# Patient Record
Sex: Female | Born: 1985 | Hispanic: Yes | Marital: Married | Smoking: Never smoker
Health system: Western US, Academic
[De-identification: ages and names within clinical notes are randomized; demographics above are authoritative.]

## PROBLEM LIST (undated history)

## (undated) DIAGNOSIS — G510 Bell's palsy: Secondary | ICD-10-CM

## (undated) DIAGNOSIS — M069 Rheumatoid arthritis, unspecified: Secondary | ICD-10-CM

## (undated) DIAGNOSIS — M26609 Unspecified temporomandibular joint disorder, unspecified side: Secondary | ICD-10-CM

## (undated) HISTORY — DX: Unspecified temporomandibular joint disorder, unspecified side: M26.609

## (undated) HISTORY — DX: Bell's palsy: G51.0

## (undated) HISTORY — DX: Rheumatoid arthritis, unspecified (CMS-HCC): M06.9

---

## 2018-08-09 IMAGING — MR MRI LSPINE WO/W CONTRAST
8 series · 48 of 48 positions shown · IV contrast (prohance)
Comparison: Lumbar spine radiographs 11/18/2017

INDICATION: Lumbar radiculopathy .
TECHNIQUE: Multiplanar, multiecho MR imaging of the lumbar spine was performed prior to and following 15 mL ProHance intravenous contrast, including T1-weighted and fluid sensitive sequences.

[Series 2: t2_sag · sagittal · 4.0mm · 0.78mm/px · 3 of 16 slices shown]
[im 1/16]
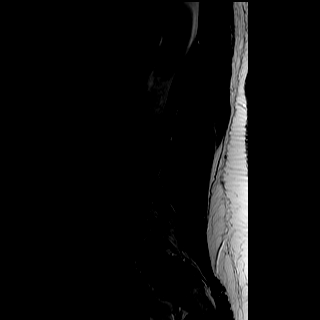
[im 8/16]
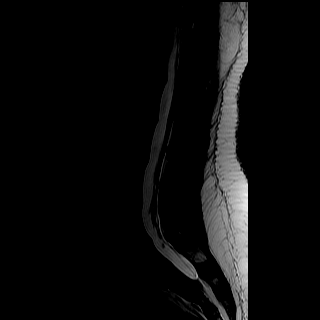
[im 16/16]
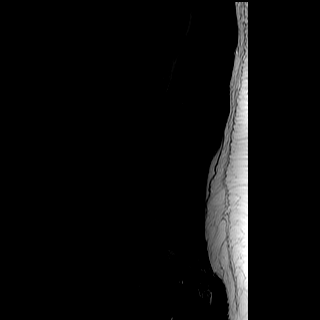

[Series 3: t1_sag · sagittal · 4.0mm · 0.78mm/px · 4 of 16 slices shown]
[im 1/16]
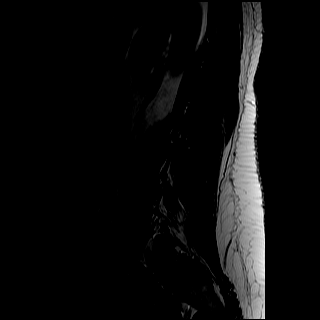
[im 6/16]
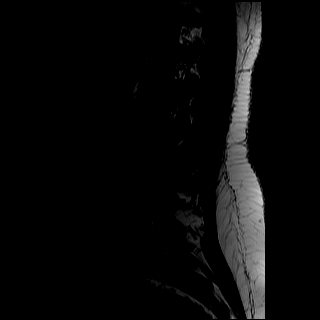
[im 11/16]
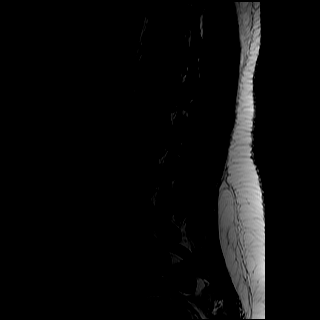
[im 16/16]
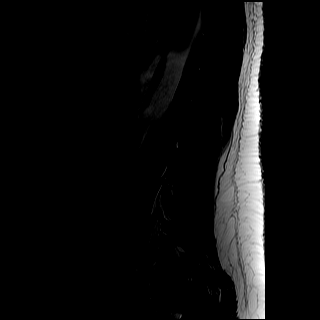

[Series 4: ir_sag · sagittal · 4.0mm · 0.98mm/px · 4 of 16 slices shown]
[im 1/16]
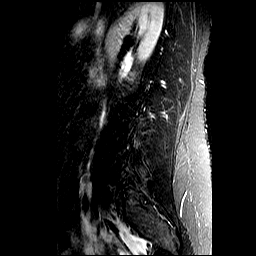
[im 6/16]
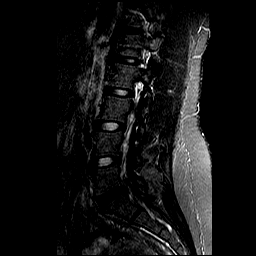
[im 11/16]
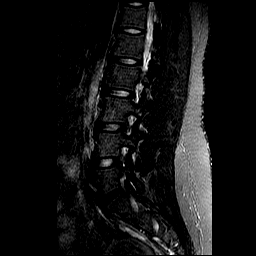
[im 16/16]
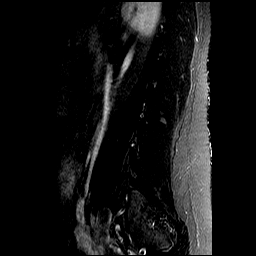

[Series 5: t2_axial · axial · 4.0mm · 0.50mm/px · z∈[-33,+137]mm · 9 of 35 slices shown]
[im 1/35]
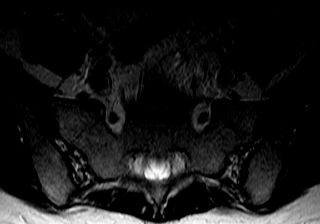
[im 5/35]
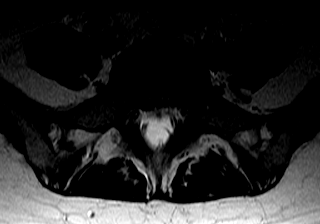
[im 9/35]
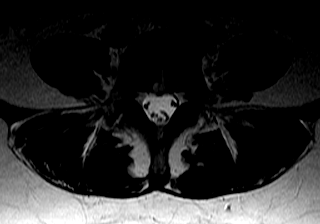
[im 13/35]
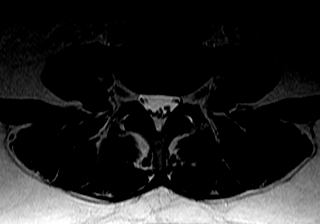
[im 18/35]
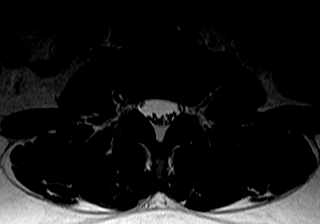
[im 22/35]
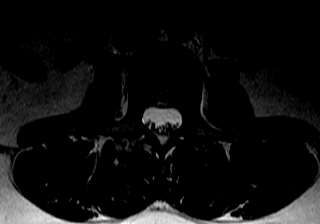
[im 26/35]
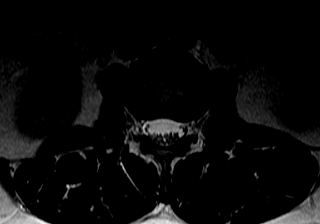
[im 30/35]
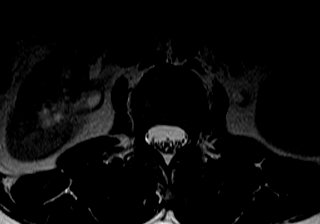
[im 35/35]
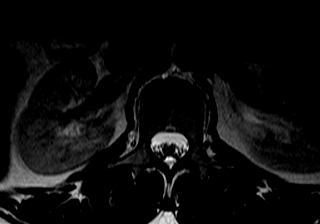

[Series 6: t1_axial_obl · axial · 4.0mm · 0.51mm/px · z∈[-34,+139]mm · 6 of 25 slices shown]
[im 1/25]
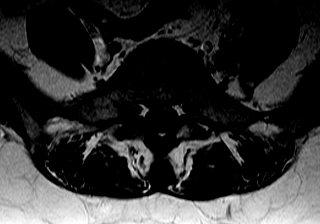
[im 5/25]
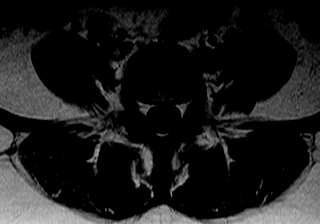
[im 10/25]
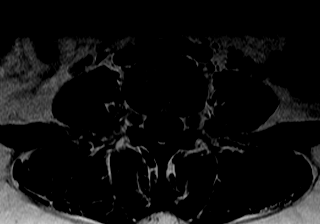
[im 15/25]
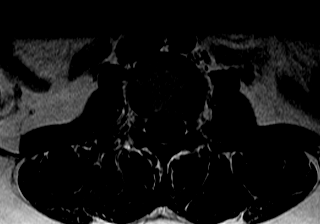
[im 20/25]
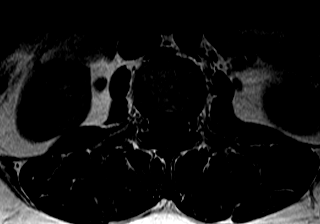
[im 25/25]
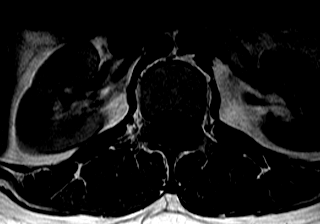

[Series 7: t1_axial_fs · axial · 4.0mm · 0.64mm/px · z∈[-33,+137]mm · 9 of 35 slices shown]
[im 1/35]
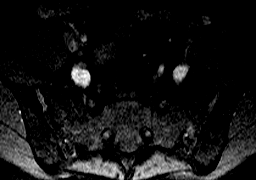
[im 5/35]
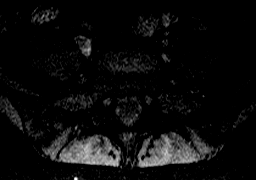
[im 9/35]
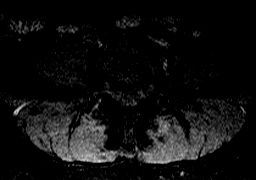
[im 13/35]
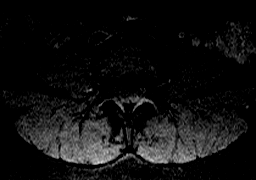
[im 18/35]
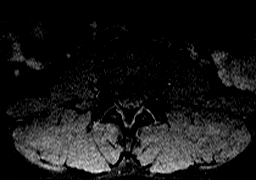
[im 22/35]
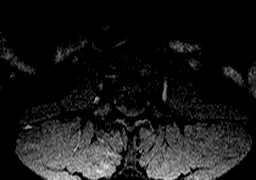
[im 26/35]
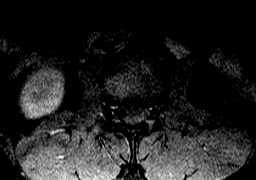
[im 30/35]
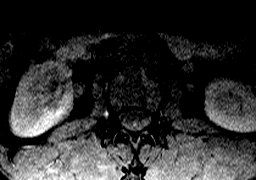
[im 35/35]
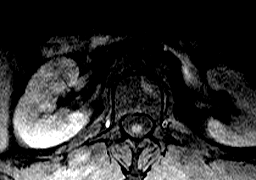

[Series 8: t1_axial_fs_+c · axial · 4.0mm · 0.64mm/px · z∈[-33,+137]mm · 9 of 35 slices shown]
[im 1/35]
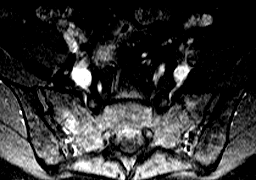
[im 5/35]
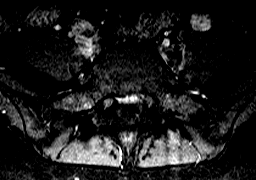
[im 9/35]
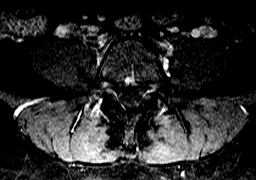
[im 13/35]
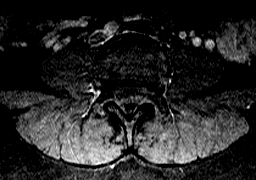
[im 18/35]
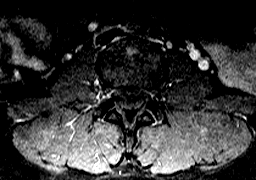
[im 22/35]
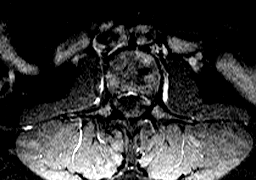
[im 26/35]
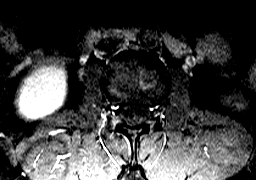
[im 30/35]
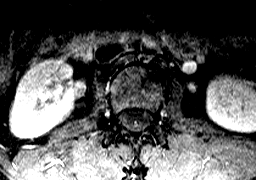
[im 35/35]
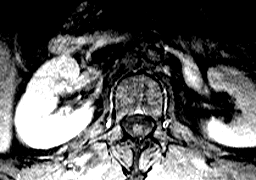

[Series 9: t1_sag_fs_+c · sagittal · 4.0mm · 0.49mm/px · 4 of 16 slices shown]
[im 1/16]
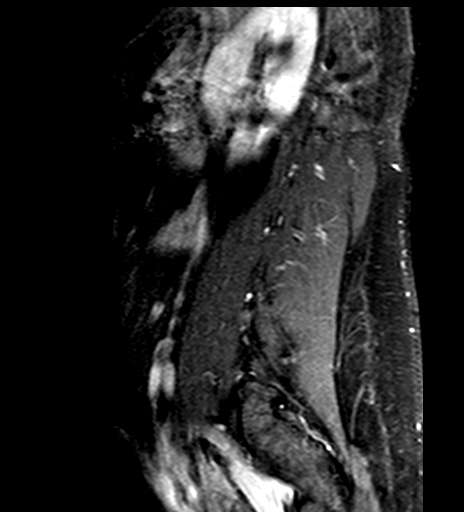
[im 6/16]
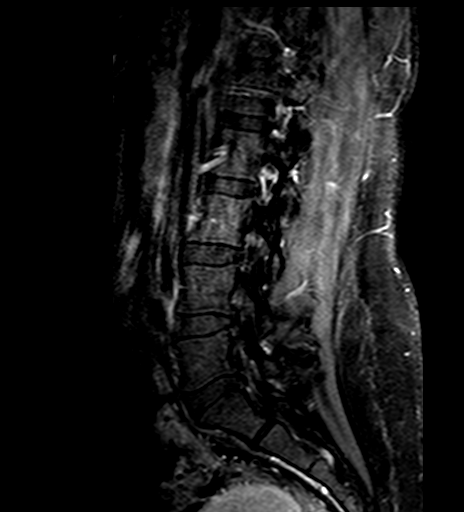
[im 11/16]
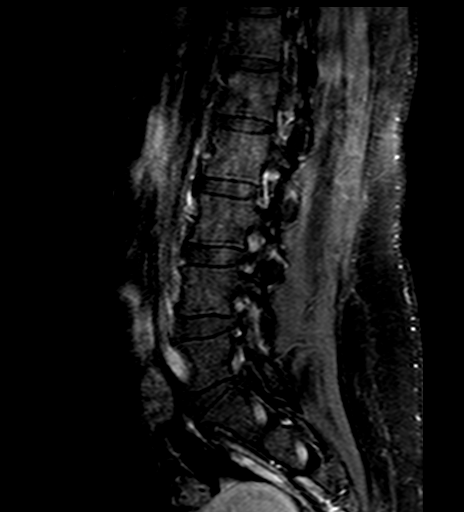
[im 16/16]
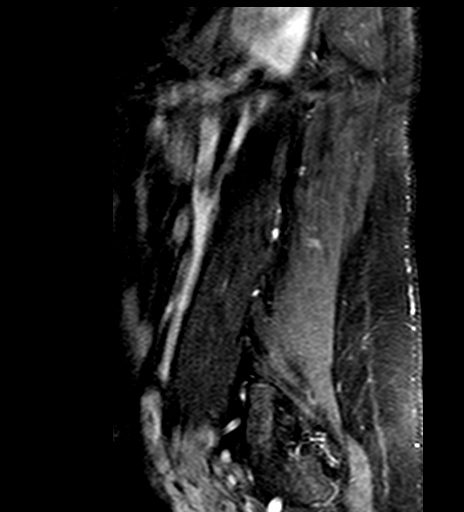

[48 of 48 positions shown; findings below may reference images not displayed]

FINDINGS: Conus: The conus is normal in appearance and position.  

Alignment: The alignment of the lumbar spine is normal on these supine, neutral images.   

Marrow: No acute fracture. No pars defect.  

T11-T12 and T12-L1 levels are seen only on the sagittal images; no posterior disc bulge, canal narrowing, or foraminal narrowing at these levels.

L1-L2: No disc protrusion, canal stenosis, or foraminal stenosis.

L2-L3: Minimal disc bulge. No canal or foraminal stenosis.

L3-L4: Minimal disc bulge. No canal or foraminal stenosis.

L4-L5: No disc protrusion, canal stenosis, or foraminal stenosis.

L5-S1: Moderate right paracentral disc protrusion with annular tear. Impingement of the descending right S1 nerve root. Moderate right lateral recess narrowing. Mild canal stenosis. No foraminal stenosis.

Visualized SI joints: Minimal DJD.

Visualized soft tissues: Unremarkable. No suspicious postcontrast enhancement.
IMPRESSION: Moderate right paracentral disc protrusion at L5-S1 with annular tear, impingement of the descending right S1 nerve root, and moderate right lateral recess narrowing.

## 2021-04-03 ENCOUNTER — Ambulatory Visit (INDEPENDENT_AMBULATORY_CARE_PROVIDER_SITE_OTHER): Admitting: Neurology

## 2021-04-03 ENCOUNTER — Encounter (INDEPENDENT_AMBULATORY_CARE_PROVIDER_SITE_OTHER): Payer: Self-pay | Admitting: Neurology

## 2021-04-03 VITALS — BP 112/75 | HR 81 | Ht 64.5 in | Wt 173.0 lb

## 2021-04-03 MED ORDER — DICLOFENAC SODIUM 1 % EX GEL
CUTANEOUS | Status: AC
Start: 2020-12-15 — End: ?

## 2021-04-03 MED ORDER — AMITRIPTYLINE HCL 10 MG OR TABS
ORAL_TABLET | ORAL | 3 refills | Status: AC
Start: 2021-04-03 — End: ?

## 2021-05-16 ENCOUNTER — Telehealth (INDEPENDENT_AMBULATORY_CARE_PROVIDER_SITE_OTHER): Admitting: Neurology

## 2021-05-29 ENCOUNTER — Encounter (INDEPENDENT_AMBULATORY_CARE_PROVIDER_SITE_OTHER): Payer: Self-pay | Admitting: Neurology

## 2021-05-29 ENCOUNTER — Telehealth: Admitting: Neurology

## 2021-05-29 VITALS — Ht 64.0 in | Wt 170.0 lb

## 2021-05-29 MED ORDER — CLINDAMYCIN PHOSPHATE 1 % EX LOTN
TOPICAL_LOTION | CUTANEOUS | Status: AC
Start: 2021-05-16 — End: ?

## 2021-05-29 MED ORDER — DRYSOL 20 % EX SOLN
CUTANEOUS | Status: AC
Start: 2021-05-16 — End: ?

## 2021-05-29 MED ORDER — RIZATRIPTAN BENZOATE 10 MG OR TABS
10.0000 mg | ORAL_TABLET | ORAL | Status: DC | PRN
Start: 2021-04-30 — End: 2022-01-25

## 2021-05-29 MED ORDER — DROSPIRENONE-ETHINYL ESTRADIOL 3-0.02 MG OR TABS
1.0000 | ORAL_TABLET | Freq: Every day | ORAL | Status: DC
Start: ? — End: 2022-01-25

## 2021-07-18 ENCOUNTER — Encounter (INDEPENDENT_AMBULATORY_CARE_PROVIDER_SITE_OTHER): Payer: Self-pay

## 2021-12-28 ENCOUNTER — Encounter (INDEPENDENT_AMBULATORY_CARE_PROVIDER_SITE_OTHER): Payer: Self-pay | Admitting: Obstetrics & Gynecology

## 2021-12-28 ENCOUNTER — Other Ambulatory Visit: Payer: TRICARE Prime—HMO | Attending: Anatomic Pathology & Clinical Pathology | Admitting: Obstetrics & Gynecology

## 2021-12-28 VITALS — BP 118/77 | HR 84 | Temp 97.9°F | Resp 19 | Ht 64.0 in | Wt 167.0 lb

## 2021-12-28 DIAGNOSIS — N939 Abnormal uterine and vaginal bleeding, unspecified: Secondary | ICD-10-CM

## 2021-12-28 DIAGNOSIS — Z23 Encounter for immunization: Secondary | ICD-10-CM

## 2021-12-28 DIAGNOSIS — N83201 Unspecified ovarian cyst, right side: Secondary | ICD-10-CM

## 2021-12-28 DIAGNOSIS — N83202 Unspecified ovarian cyst, left side: Secondary | ICD-10-CM

## 2021-12-28 DIAGNOSIS — R87810 Cervical high risk human papillomavirus (HPV) DNA test positive: Secondary | ICD-10-CM

## 2021-12-28 DIAGNOSIS — Z202 Contact with and (suspected) exposure to infections with a predominantly sexual mode of transmission: Secondary | ICD-10-CM

## 2021-12-28 DIAGNOSIS — R232 Flushing: Secondary | ICD-10-CM | POA: Insufficient documentation

## 2021-12-28 DIAGNOSIS — Z124 Encounter for screening for malignant neoplasm of cervix: Secondary | ICD-10-CM | POA: Insufficient documentation

## 2021-12-28 DIAGNOSIS — R87610 Atypical squamous cells of undetermined significance on cytologic smear of cervix (ASC-US): Secondary | ICD-10-CM

## 2021-12-28 DIAGNOSIS — D069 Carcinoma in situ of cervix, unspecified: Secondary | ICD-10-CM

## 2021-12-28 DIAGNOSIS — E282 Polycystic ovarian syndrome: Secondary | ICD-10-CM | POA: Insufficient documentation

## 2021-12-28 NOTE — Progress Notes (Unsigned)
ID: Dana Ward is a 36 year old [id number] who presents today for history of CIN3, PCOS, pelvic pain. Dana Ward was referred to this clinic by Dr. Letta Pate.    HPI: Dana Ward is referred with the following information:        Seeing folks for TMJ (on amitryp)  Also seeing folks for RA - on gabapentin    PCOS: hot flashes, sweating    Hyperhydrosis    Gave birth to son in 2017. Has had lower pain since then.   Reports such severe pelvic pain that has gone to ED, was told multiple cysts? This happened earlier this year (?April)?    She has a history of a LEEP 03/2020 in [place]  F/u have been abnormal due to HPV. Had colposcopy, was told abnormal cells (doesn't recall what they were). 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  Of note, in CareEverywhere, Narrative from 05/2020 CNM visit: "1. History of loop electrosurgical excision procedure    Reviewed negative ECC results from last visit and discussed negative exam findings on today's colpo.  Recomend 12 mos co-testing since she isn't 12 mos since LEEP and recent ECC B9 with negative colpo today.  She does not exactly follow the ASCCP guidelines since there is no 6 month post LEEP surveillance."    Last pap was 04/2021 - no colposcopy after that, but was told that there is HPV present    Not using contraception currently  Was put on birth control, but would lose hair, cough blood.  Has tried Nexplanon. Husband of 16y.  Has allergies to some condoms    Periods: irregular, skips months at times; heavy with blood clots 4-5d with some spotting before and after for a couple days. Have always been this way    Husband in the [employer]. Plans on staying in [place] here for a while.    OB/GYN History:  [id number]  History of abnormal pap smears: as above   History of sexually transmitted infections: no  Last mammogram: n/a    Past Medical History:   Diagnosis Date    Bell's palsy     resolved    Rheumatoid  arthritis (CMS-HCC)     TMJ (temporomandibular joint syndrome)        Past Surgical History:   Procedure Laterality Date    CERVICAL BIOPSY W/ LOOP ELECTRODE EXCISION  03/2020    CHOLECYSTECTOMY  2017    Laparoscopic       Family History   Problem Relation Name Age of Onset    Breast Cancer Mother's Sister  73       Social History:   reports that she has never smoked. She has never used smokeless tobacco. She reports that she does not currently use alcohol. She reports that she does not use drugs.  Social History     Substance and Sexual Activity   Sexual Activity Yes    Partners: Male       Allergies:  is allergic to erythromycin.    Meds:  I have reviewed the patient's medications; see Medication Reconciliation. and   Current Outpatient Medications:     amitriptyline (ELAVIL) 10 MG tablet, Take 1 tablet 2 hours prior to bedtime, after 2 weeks may increase to 2 tablets if tolerated (Patient taking differently: Take 2 tablets (20 mg) by mouth nightly.), Disp: 180 tablet, Rfl: 3    clindamycin phosphate (CLEOCIN T) 1 % lotion, , Disp: ,  Rfl:     diclofenac (VOLTAREN) 1 % gel,  See instructions, Apply 2 grams (upper extremities) or 4 grams (lower extremities) to affected area topically four times daily as needed for pain. Max total body dose of 32 grams per Dana, # 100 g, 0 total refill(s), Maintenance, Apply 2 grams (upper e..., Disp: , Rfl:     drospirenone-ethinyl estradiol (LORYNA) 3-0.02 MG tablet, Take 1 tablet by mouth daily., Disp: , Rfl:     DRYSOL 20 % external solution, , Disp: , Rfl:     rizatriptan (MAXALT) 10 MG tablet, Take 1 tablet (10 mg) by mouth as needed., Disp: , Rfl:             PHYSICAL EXAMINATION:    BP 118/77 (BP Location: Right arm, BP Patient Position: Sitting, BP cuff size: Regular)   Pulse 84   Temp 97.9 F (36.6 C) (Temporal)   Resp 19   Ht 5\' 4"  (1.626 m)   Wt 75.8 kg (167 lb)   LMP 12/19/2021 (Exact Date)   SpO2 99%   BMI 28.67 kg/m  Body mass index is 28.67 kg/m.    General  Appearance: in no apparent distress and well developed and well nourished  Psych: euthymic  Eyes: conjunctivae and sclera normal  Neck: Neck supple. No adenopathy. Thyroid symmetric, normal size,   Heart: regular rate and rhythm  Abdomen: Abdomen soft, non-tender. BS normal. No masses, organomegaly  Lymph: no groin nodes  Skin: no lesions/rashes  Pelvic Exam:  Vulva: Bartholin's glands, urethra, Skene's glands negative   Urethra: normal  Vagina: Normal mucosa without lesions with a small amount of normal and physiologic discharge  Cervix: Closed, mobile, friable cervix, no discrete lesions or discharge and No cervical motion tenderness  Uterus: normal size, shape, contour  Adnexa: normal without masses and no tenderness  Anus/Perineum: normal appearing, no lesions    Assessment and plan: 36 year old 29 who presents today for:    Problem List Items Addressed This Visit    None  Visit Diagnoses       High grade squamous intraepithelial lesion (HGSIL), grade 3 CIN, on biopsy of cervix    -  Primary    Need for vaccination        Relevant Orders    Flu Vaccine (Fluarix), IM, 6+ mo (Completed)    Cysts of both ovaries        Relevant Orders    [id number] Pelvic Transabd/Transvag Combination    STD exposure        Relevant Orders    Chlamydia/Gonorrhea PCR, Genital Cobas PCR collection swab    PCOS (polycystic ovarian syndrome)        Relevant Orders    Testo Free + Total Female/Child SST    Hot flashes        Relevant Orders    TSH, Blood - See Instructions    Cervical cancer screening        Relevant Orders    Cytopath Gyn (Pap Smear) (Completed)    Abnormal uterine bleeding (AUB)              #History of CIN3 - given history and friable cervix, pap smear obtained today to monitor  - will need colposcopy if abnormal  - reviewed that given her prior history and notes indicating that there has not been an abnormality on colposcopy on her last colpo, that there is no indication for hysterectomy     #Cyst of ovaries, pelvic  pain - No obvious etiology on examination. I reviewed possible causes including gynecologic, urologic, GI, and MSK. I reviewed possible structural causes including uterine or adnexal masses. I discussed the possible contribution of pelvic floor dysfunction given coincidence since delivery.   - pelvic US ordered, given interval time  - discussed ovulation suppression for symptom management, she is open to options     #PCOS - reported history. I reviewed in depth the clinical significance of this syndrome, including glucose intolerance, effects on fertility, and risk of endometrial hyperplasia when it comes to oligomenorrhea. I discussed the importance of weight loss for management of PCOS, as well as possibility of need for ovulation induction in the future for pregnancy.   I reviewed hormonal management options for PCOS, including OCPs, patch, ring, DMPA, Nexplanon, and Mirena IUD for endometrial protection.   I also reviewed use of antiandrogens for management of hyperandrogenism.  - testosterone ordered    #STI screening, friable cervix  - GC/CT swab obtained    #AUB - could be from PCOS  - pending [place] results, will discuss further    Esther Hardy, MD

## 2022-01-07 LAB — HPV HIGH RISK DNA PROBE, FEMALE
HPV High Risk Genotype 16: DETECTED — AB
HPV High Risk Genotype 18: NOT DETECTED
HPV Other High Risk Genotypes (Not type 16 or 18): NOT DETECTED

## 2022-01-21 ENCOUNTER — Ambulatory Visit
Admission: RE | Admit: 2022-01-21 | Discharge: 2022-01-21 | Disposition: A | Discharge status: Home / Self Care | Payer: TRICARE Prime—HMO | Attending: Obstetrics & Gynecology | Admitting: Obstetrics & Gynecology

## 2022-01-21 ENCOUNTER — Encounter (INDEPENDENT_AMBULATORY_CARE_PROVIDER_SITE_OTHER): Payer: Self-pay | Admitting: Obstetrics & Gynecology

## 2022-01-21 DIAGNOSIS — N83201 Unspecified ovarian cyst, right side: Secondary | ICD-10-CM | POA: Insufficient documentation

## 2022-01-21 DIAGNOSIS — N83202 Unspecified ovarian cyst, left side: Secondary | ICD-10-CM | POA: Insufficient documentation

## 2022-01-21 DIAGNOSIS — N838 Other noninflammatory disorders of ovary, fallopian tube and broad ligament: Secondary | ICD-10-CM

## 2022-01-24 ENCOUNTER — Other Ambulatory Visit (INDEPENDENT_AMBULATORY_CARE_PROVIDER_SITE_OTHER)
Admission: RE | Admit: 2022-01-24 | Discharge: 2022-01-24 | Disposition: A | Discharge status: Home / Self Care | Payer: TRICARE Prime—HMO | Attending: Obstetrics & Gynecology | Admitting: Obstetrics & Gynecology

## 2022-01-24 DIAGNOSIS — R232 Flushing: Secondary | ICD-10-CM | POA: Insufficient documentation

## 2022-01-24 DIAGNOSIS — E282 Polycystic ovarian syndrome: Secondary | ICD-10-CM | POA: Insufficient documentation

## 2022-01-24 LAB — TSH, BLOOD: TSH: 0.93 u[IU]/mL (ref 0.27–4.20)

## 2022-01-25 ENCOUNTER — Encounter (INDEPENDENT_AMBULATORY_CARE_PROVIDER_SITE_OTHER): Payer: Self-pay | Admitting: Obstetrics & Gynecology

## 2022-01-25 ENCOUNTER — Other Ambulatory Visit (INDEPENDENT_AMBULATORY_CARE_PROVIDER_SITE_OTHER): Payer: TRICARE Prime—HMO | Attending: Obstetrics & Gynecology

## 2022-01-25 ENCOUNTER — Ambulatory Visit (INDEPENDENT_AMBULATORY_CARE_PROVIDER_SITE_OTHER): Payer: TRICARE Prime—HMO | Admitting: Obstetrics & Gynecology

## 2022-01-25 VITALS — BP 110/77 | HR 74 | Temp 98.0°F | Resp 16 | Ht 64.0 in | Wt 168.0 lb

## 2022-01-25 DIAGNOSIS — Z87410 Personal history of cervical dysplasia: Secondary | ICD-10-CM

## 2022-01-25 DIAGNOSIS — B977 Papillomavirus as the cause of diseases classified elsewhere: Secondary | ICD-10-CM | POA: Insufficient documentation

## 2022-01-25 DIAGNOSIS — N72 Inflammatory disease of cervix uteri: Secondary | ICD-10-CM | POA: Insufficient documentation

## 2022-01-25 DIAGNOSIS — N949 Unspecified condition associated with female genital organs and menstrual cycle: Secondary | ICD-10-CM | POA: Insufficient documentation

## 2022-01-25 DIAGNOSIS — R102 Pelvic and perineal pain: Secondary | ICD-10-CM

## 2022-01-25 MED ORDER — GABAPENTIN PO: ORAL | Status: AC

## 2022-01-25 MED ORDER — DOXYCYCLINE HYCLATE 100 MG OR CAPS
100.0000 mg | ORAL_CAPSULE | Freq: Two times a day (BID) | ORAL | 0 refills | Status: AC
Start: 2022-01-25 — End: ?

## 2022-01-25 MED ORDER — IBUPROFEN 600 MG OR TABS
600.0000 mg | ORAL_TABLET | Freq: Once | ORAL | Status: AC
Start: 2022-01-25 — End: 2022-01-25
  Administered 2022-01-25: 600 mg via ORAL

## 2022-01-25 NOTE — Progress Notes (Signed)
Chief Complaint   Patient presents with    Colposcopy       History:  36 year old female presents today for follow up for pelvic pain, AUB, PCOS, abnormal pap smear. She was last seen 12/28/2021.    See prior note for details regarding her history of CIN3 s/p LEEP with subsequent abnormal pap smears, history of irregular periods, and pelvic pain.   She had a pap smear that showed ASCUS with HPV 16+.  She was also ordered labs.   She was ordered a pelvic US to assess for causes of irregular bleeding and pelvic pain.  She is most bothered by pelvic pain (intermittent, not laways associated with bleeding, can be severe) and HPV, she worries about cervical cancer.    Does endorse hair on chin.    Review of Systems  Constitutional: Unusual fatigue;Weight loss;Loss of appitite  Eyes: Glasses/Contacts  ENT: Ringing in ears  Cardiac: Negative  Pulmonary: Negative  Gastrointestional: Chronic diarrhea  Musculoskeletal: Pain in joints;Lower back pain;Muscle weakness  Skin: Unexplained Rash  Neurologic: Frequent headaches  Psychiatric: Difficulty Sleeping  Endocrine: Hot flashes;Dry skin;Sensitive to heat/cold  Blood Disease: Negative  Allergy: Negative  OB/Gyn: Pelvic pain    I have reviewed her past medical, surgical, family, social hx, allergies, medications,  and problem list, since her last visit 12/28/2021.  There has been no interval change unless otherwise indicated in the electronic record.    Exam:    Vitals: BP 110/77 (BP Location: Right arm, BP Patient Position: Sitting, BP cuff size: Regular)   Pulse 74   Temp 98 F (36.7 C) (Temporal)   Resp 16   Ht 5\' 4"  (1.626 m)   Wt 76.2 kg (168 lb)   LMP 01/15/2022 (Exact Date)   SpO2 99%   BMI 28.84 kg/m     Neuro/psych:  Alert and oriented x 3 in a pleasant mood  Skin: Warm, dry and no lesions  Abdomen: Abdomen soft, non-tender. BS normal. No masses, organomegaly  Pelvic Exam:  Vulva: Bartholin's glands, urethra, Skene's glands negative  Urethra: normal  Vagina:  Normal mucosa without lesions with a small amount of normal and physiologic discharge  Cervix: Closed, mobile, no lesions or discharge but with equivocal cervical motion tenderness  Uterus: mild uterine fundal tenderness, no posterior cul de sac ttp  Adnexa: normal without masses and no tenderness  Anus/Perineum: normal appearing, no lesions    FINDINGS:  UTERUS:  Anteverted with a globular configuration that is somewhat heterogeneous with an ill-defined junctional zone  Size: 7.7 x 9.2 x 5.2 cm  0.9 cm endometrial thickness  Unremarkable     RT OVARY:  Size: 4.2 x 3.8 x 2.4 cm  Ovarian volume 19.7 with multiple peripheral follicles.  Blood flow in artery     LT OVARY:  Size: 3.6 x 2.3 x 1.7 cm  Ovarian volume 7.1 with multiple peripheral follicles.  Blood flow in artery      Assessment & Plan:  36 year old 29 here for follow-up with the following issues:    Problem List Items Addressed This Visit    None    #PCOS  Testosterone pending, but given imaging findings and infrequent periods, most consistent with PCOS. Had previously discussed endometrial protection options, and she opts for Mirena IUD.  - return for placement    #Pelvic pain - I reviewed her symptoms and imaging with no clear discernible cause. I reviewed possibility of adenomyosis, but given discordance with periods this seems  less likely (as does endometriosis). I reviewed other causes including non gyn, including MSK.  - given mild CMT on exam, recommend tx for PID  - return for IUD insertion as trial of management of pelvic pain    #ASCUS, HPV+  - see separate procedure note  - reviewed [facility] shiitake mushroom extract supplement with promising evidence    The visit  was approximately 30 minutes of which >50% was spent on counseling the patient on the assessment and treatment options.     Williemae Area, MD

## 2022-01-25 NOTE — Patient Instructions (Signed)
Jerome Shiitake Mushroom extract  Active hexose correlated compound

## 2022-01-25 NOTE — Interdisciplinary (Signed)
Urine drop off

## 2022-01-25 NOTE — Progress Notes (Unsigned)
Procedure: Colposcopy    Date: January 25, 2022    Patient: Dana Ward  Medical Record #: [id number]   Age: 36 year old     Patient presents for colposcopy. Dysplasia history as follows:    01/2020 - LEEP CIN3  2022 - pap with HPV+ (per report) and colpo without abnormality  04/2021 - reportedly positive HPV but no record available, no subsequent f/u  12/28/2021 - ASCUS, +HR-HPV    HIV testing: neg  Tobacco: denies    Patient's last menstrual period was 12/19/2021 (exact date).  Allergies: Erythromycin    I reviewed her pap smear results in depth, including the pathophysiology of HPV and her cytological results. I reviewed the goal of colposcopy is to evaluate for high risk dysplasia.  Procedure reviewed with patient as well as risks of cervical biopsy and endocervical curettage including pain, bleeding, infection, and damage to surrounding organs.  Written consent obtained.  Urine pregnancy test: neg  Time Out: performed at 10 AM.  Correct patient:  yes  Correct procedure:  yes  Correct side/site:  yes  Correct position:  yes     PROCEDURE:  Speculum placed in vagina and excellent visualization of cervix achieved. Cervix swabbed x 3 with acetic acid solution.     FINDINGS:  Cervix: evidence of prior LEEP, with some mild ACE from 5-9 o'clock, some increased vascularity at 9 o'clock and punctations at 5 o'clock; patient very tender with sample at 9 o'clock, reminiscent of CMT; no ECC obtained due to severe patient discomfort    Vaginal inspection: vaginal colposcopy not performed.    Vulvar colposcopy: vulvar colposcopy not performed.    Procedure Summary: not tolerated well.    ASSESSMENT: HPV related changes.    PLAN: specimens labelled and sent to Pathology, will base further treatment on Pathology findings, and post biopsy instructions given to patient.  - treat empirically for PID given response to biopsies (See separate note)    Williemae Area, MD

## 2022-01-29 LAB — CHLAMYDIA/GONORRHEA PCR, URINE
Chlamydia trachomatis PCR, Urine: NOT DETECTED
Neisseria gonorrhoeae PCR, Urine: NOT DETECTED

## 2022-02-01 LAB — TESTO FREE + TOTAL FEMALE/CHILD
Sex Hormone Binding Globulin: 24 nmol/L — ABNORMAL LOW (ref 25–122)
TESTOSTERONE, FEMALE/CHILDREN  LC-MS/MS: 27 ng/dL (ref 9–55)
Testo-Free, Female/Child (Calculated): 5.3 pg/mL (ref 0.8–9.2)

## 2024-04-02 ENCOUNTER — Telehealth (INDEPENDENT_AMBULATORY_CARE_PROVIDER_SITE_OTHER): Payer: Self-pay

## 2024-04-02 NOTE — Telephone Encounter (Signed)
 Auth received for Neurology, but we are missing progress notes to transcribe, so it is incomplete

## 2024-04-21 DIAGNOSIS — M26609 Unspecified temporomandibular joint disorder, unspecified side: Secondary | ICD-10-CM

## 2024-04-26 ENCOUNTER — Ambulatory Visit (INDEPENDENT_AMBULATORY_CARE_PROVIDER_SITE_OTHER): Admitting: Student in an Organized Health Care Education/Training Program

## 2024-04-26 VITALS — BP 127/79 | HR 65 | Temp 97.5°F | Resp 18 | Ht 64.0 in | Wt 152.0 lb

## 2024-04-26 DIAGNOSIS — S0300XA Dislocation of jaw, unspecified side, initial encounter: Secondary | ICD-10-CM

## 2024-04-26 DIAGNOSIS — G43119 Migraine with aura, intractable, without status migrainosus: Secondary | ICD-10-CM

## 2024-04-26 NOTE — Progress Notes (Signed)
 NEUROLOGY CONSULTATION NOTE    NAME:  Dana Ward  DOB:  05-24-1985  MRN:  [id number]  DATE OF EVALUATION:  04/26/2024    REASON FOR VISIT: TMJ    HISTORY OF PRESENT ILLNESS  Dana Ward is a 39 year old woman referred by Nyle Boots, PA. She is unaccompanied in the office today. History was obtained via patient/collateral source report and personal review of prior medical records.     Headache characteristics:  Onset: Headaches first started in young adulthood.  Frequency: was near daily without treatment, then this past year with Botox headache became rare, no more than a handful minor ones per month.   Duration: Headaches last from 2 hours up to 2 days.   Location: Varies is sometimes on the right side and sometimes on the left side then becomes holocephalic;    The pain is described as: pressure  Worsened by exertion/activity: Yes  Associated nausea or vomiting: No  Photophobia and phonophobia: Yes  Aura: Yes    Neck pain: Mild, used to feel tighter, used to see chiropractor.   TMJ, Teeth Clenching, Bruxism: Yes, and wears nightguard      Prior workup and management includes:  MRI Brain: 11/2021 - per records normal  LP: No  Spinal surgery: No  Sleep study: No    Treatment History:  Prior failed headache preventive: Elavil . Had also been taking this PRN for headache pain.   Prior failed headache rescue: Maxalt  - never needed it once botox started.   Current headache preventive: She was getting botox for migraines and TMJ. [facility]. botox q 4months for the past year.  Last done Sep 22nd.  Current headache rescue: None  Urgent visits?  Other pain medications: No  History of nerve blocks: No    Trigger point injections: No  Adjunctive treatments: Chiropractic treatment      REVIEW OF SYSTEMS  As per HPI     MEDICAL HISTORY  Past Medical History[1]    SURGICAL HISTORY  Past Surgical History[2]    ALLERGIES  Erythromycin    CURRENT MEDICATIONS    amitriptyline  Take 1 tablet 2 hours prior to bedtime, after 2 weeks may increase to 2 tablets if tolerated (Patient taking differently: Take 2 tablets (20 mg) by mouth nightly.) 180 tablet 3    carboxymethylcellulose Place 1 drop into both eyes every 4 hours as needed.      clindamycin  phosphate       diclofenac    See instructions, Apply 2 grams (upper extremities) or 4 grams (lower extremities) to affected area topically four times daily as needed for pain. Max total body dose of 32 grams per day, # 100 g, 0 total refill(s), Maintenance, Apply 2 grams (upper e... (Patient taking differently: PRN)      doxycycline  Take 1 capsule (100 mg) by mouth 2 times daily. 28 capsule 0    Drysol       GABAPENTIN  PO       Humira (2 Pen) Inject 0.8 mL (40 mg) under the skin every 14 days.      tretinoin Apply topically nightly.         SOCIAL HISTORY  none     Socioeconomic History    Marital status: Married   Tobacco Use    Smoking status: Never    Smokeless tobacco: Never   Substance and Sexual Activity    Alcohol use: Not Currently    Drug use: Never  Sexual activity: Yes     Partners: Male       FAMILY HISTORY  Family History[3]    PHYSICAL EXAM  VITALS:    Vitals:    04/26/24 1006   BP: 127/79   BP Location: Right arm   BP Patient Position: Sitting   BP cuff size: Regular   Pulse: 65   Resp: 18   Temp: 97.5 F (36.4 C)   TempSrc: Temporal   SpO2: 100%   Weight: 68.9 kg (152 lb)   Height: 5' 4 (1.626 m)     EXAM:  There were no vitals taken for this visit.  GENERAL EXAM: NAD, well-developed, well-nourished    NEUROLOGIC EXAM:  Mental Status  Awake, alert, oriented to person/place/time  Fluent, conversant speech, interactive with good eye contact  Normal language output and comprehension    Cranial Nerves  III, IV, VI: EOMI intact, no nystagmus  V: facial sensation intact bilaterally to pt's own touch   VII: face symmetric, no nasolabial flattening  VIII: hearing intact to conversational voice  IX, X: no hoarseness of  voice  XI: shoulder shrug b/l  XII: tongue protrusion is midline    Motor  Normal bulk   UE without drift  Moves both arms anti-gravity and symmetric    Coordination   Intact finger-nose testing bilaterally without dysmetria or tremor    Gait and Stance:  Normal based stance. Gait appears well balanced and does not sway to one side or fall. Able to perform tandem, heel, toe.         Review of Imaging Studies:  No results found.     Review of non-imaging diagnostic procedures:  None.    Review of Labs:  Lab Results   Component Value Date    TSH 0.93 01/24/2022       No results found for: BUN, CREAT, CL, NA, K, CA, TBILI, ALB, TP, AST, ALK, BICARB, ALT, GLU      ASSESSMENT  Chronic migraine without aura  TMD, tension headache    39 y/o woman with PMHx migraines, TMD, tension headaches, ankylosing spondylitis presenting to pursue Botox for TMD and tension/migraine related headaches. She has found immense success with botox this past year but due to insurance lapse needs to re-establish care for this and I am happy to proceed as this treatment has proven immensely beneficial for her.     PLAN  - Please provide medical records from [facility] center where you received botox  - Conservative strategies to improve headache have been provided     Follow up Mar 6th @10 :30am for Botox       TOTAL TIME SPENT INCLUDING  FACE-TO-FACE ENCOUNTER, RECORD REVIEW, & DOCUMENTATION:   60 minutes    I counseled patient on diagnosis, treatment options, treatment instructions, adverse effects and overall prognosis    Thank you for the opportunity to participate in this patient's care and for this referral.  If you have any further questions, please don't hesitate to contact me.    Sincerely,    Camellia Leventhal, MD  [facility] Department of Neurosciences   Comprehensive Neurology, Movement Disorders  Health Sciences Clinical Instructor  [facility]   [address], [place]  [address]  Office: [phone]   Fax: [phone]          [1]   Past Medical History:  Diagnosis Date    Bell's palsy     resolved  Rheumatoid arthritis     TMJ (temporomandibular joint syndrome)    [2]   Past Surgical History:  Procedure Laterality Date    CERVICAL BIOPSY W/ LOOP ELECTRODE EXCISION  03/2020    CHOLECYSTECTOMY  2017    Laparoscopic   [3]   Family History  Problem Relation Name Age of Onset    Breast Cancer Mother's Sister  46

## 2024-06-18 ENCOUNTER — Ambulatory Visit (INDEPENDENT_AMBULATORY_CARE_PROVIDER_SITE_OTHER): Admitting: Student in an Organized Health Care Education/Training Program
